# Patient Record
Sex: Female | Born: 1990 | Race: White | Hispanic: No | Marital: Single | Smoking: Current every day smoker
Health system: Southern US, Community
[De-identification: ages and names within clinical notes are randomized; demographics above are authoritative.]

## PROBLEM LIST (undated history)

## (undated) HISTORY — PX: TUBAL LIGATION: SHX77

---
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
## 2009-03-03 ENCOUNTER — Ambulatory Visit (HOSPITAL_COMMUNITY): Admission: RE | Admit: 2009-03-03 | Discharge: 2009-03-03 | Payer: Self-pay | Admitting: Obstetrics & Gynecology

## 2009-04-14 ENCOUNTER — Ambulatory Visit (HOSPITAL_COMMUNITY): Admission: RE | Admit: 2009-04-14 | Discharge: 2009-04-14 | Payer: Self-pay | Admitting: Obstetrics & Gynecology

## 2009-04-26 ENCOUNTER — Inpatient Hospital Stay (HOSPITAL_COMMUNITY): Admission: AD | Admit: 2009-04-26 | Discharge: 2009-04-26 | Payer: Self-pay | Admitting: Obstetrics & Gynecology

## 2009-04-26 ENCOUNTER — Ambulatory Visit: Payer: Self-pay | Admitting: Family

## 2009-05-02 ENCOUNTER — Ambulatory Visit (HOSPITAL_COMMUNITY): Admission: RE | Admit: 2009-05-02 | Discharge: 2009-05-02 | Payer: Self-pay | Admitting: Obstetrics and Gynecology

## 2009-05-04 ENCOUNTER — Ambulatory Visit: Payer: Self-pay | Admitting: Advanced Practice Midwife

## 2009-05-04 ENCOUNTER — Inpatient Hospital Stay (HOSPITAL_COMMUNITY): Admission: AD | Admit: 2009-05-04 | Discharge: 2009-05-05 | Payer: Self-pay | Admitting: Obstetrics and Gynecology

## 2009-05-06 ENCOUNTER — Inpatient Hospital Stay (HOSPITAL_COMMUNITY): Admission: AD | Admit: 2009-05-06 | Discharge: 2009-05-07 | Payer: Self-pay | Admitting: Obstetrics & Gynecology

## 2009-05-06 ENCOUNTER — Ambulatory Visit: Payer: Self-pay | Admitting: Obstetrics & Gynecology

## 2009-05-25 ENCOUNTER — Ambulatory Visit (HOSPITAL_COMMUNITY): Admission: RE | Admit: 2009-05-25 | Discharge: 2009-05-25 | Payer: Self-pay | Admitting: Obstetrics and Gynecology

## 2009-08-23 ENCOUNTER — Emergency Department (HOSPITAL_COMMUNITY): Admission: EM | Admit: 2009-08-23 | Discharge: 2009-08-23 | Payer: Self-pay | Admitting: Emergency Medicine

## 2009-09-12 ENCOUNTER — Emergency Department (HOSPITAL_COMMUNITY): Admission: EM | Admit: 2009-09-12 | Discharge: 2009-09-12 | Payer: Self-pay | Admitting: Emergency Medicine

## 2009-10-06 ENCOUNTER — Emergency Department (HOSPITAL_COMMUNITY): Admission: EM | Admit: 2009-10-06 | Discharge: 2009-10-06 | Payer: Self-pay | Admitting: Emergency Medicine

## 2010-02-05 ENCOUNTER — Encounter: Payer: Self-pay | Admitting: Obstetrics & Gynecology

## 2010-04-04 LAB — RAPID URINE DRUG SCREEN, HOSP PERFORMED
Barbiturates: NOT DETECTED
Benzodiazepines: NOT DETECTED
Cocaine: NOT DETECTED
Opiates: POSITIVE — AB
Tetrahydrocannabinol: NOT DETECTED

## 2010-04-04 LAB — URINALYSIS, ROUTINE W REFLEX MICROSCOPIC
Bilirubin Urine: NEGATIVE
Glucose, UA: NEGATIVE mg/dL
Hgb urine dipstick: NEGATIVE
Ketones, ur: NEGATIVE mg/dL
Nitrite: NEGATIVE
Protein, ur: NEGATIVE mg/dL
Specific Gravity, Urine: 1.01 (ref 1.005–1.030)
Urobilinogen, UA: 0.2 mg/dL (ref 0.0–1.0)
pH: 6.5 (ref 5.0–8.0)

## 2010-04-04 LAB — FETAL FIBRONECTIN: Fetal Fibronectin: POSITIVE — AB

## 2010-04-04 LAB — GC/CHLAMYDIA PROBE AMP, GENITAL: GC Probe Amp, Genital: NEGATIVE

## 2010-04-25 LAB — WET PREP, GENITAL: Trich, Wet Prep: NONE SEEN

## 2010-04-25 LAB — URINALYSIS, ROUTINE W REFLEX MICROSCOPIC
Glucose, UA: NEGATIVE mg/dL
Ketones, ur: NEGATIVE mg/dL
Specific Gravity, Urine: <1.005 — ABNORMAL LOW (ref 1.005–1.030)

## 2010-04-25 LAB — URINE MICROSCOPIC-ADD ON

## 2010-04-25 LAB — RAPID URINE DRUG SCREEN, HOSP PERFORMED: Tetrahydrocannabinol: NOT DETECTED

## 2010-04-27 LAB — DIFFERENTIAL
Eosinophils Relative: 1 % (ref 0–5)
Monocytes Relative: 4 % (ref 3–12)

## 2010-04-27 LAB — URINALYSIS, ROUTINE W REFLEX MICROSCOPIC
Bilirubin Urine: NEGATIVE
Glucose, UA: NEGATIVE mg/dL
Hgb urine dipstick: NEGATIVE
Nitrite: NEGATIVE
Urobilinogen, UA: 0.2 mg/dL (ref 0.0–1.0)

## 2010-04-27 LAB — URINE CULTURE

## 2010-04-27 LAB — CBC
Hemoglobin: 12.6 g/dL (ref 12.0–15.0)
MCHC: 34.8 g/dL (ref 30.0–36.0)
WBC: 5.9 10*3/uL (ref 4.0–10.5)

## 2010-05-31 IMAGING — US US OB DETAIL+14 WK
1 series · 14 of 28 positions shown · non-contrast
Comparison: none

OBSTETRICAL ULTRASOUND:
 This ultrasound was performed in The [HOSPITAL], and the [facility] report will be stored to [REDACTED] PACS.  This report is also available in [HOSPITAL]?s accessANYware.

[Series 1: us ob detail+14 wk · 14 of 93 slices shown]
[im 4/93]
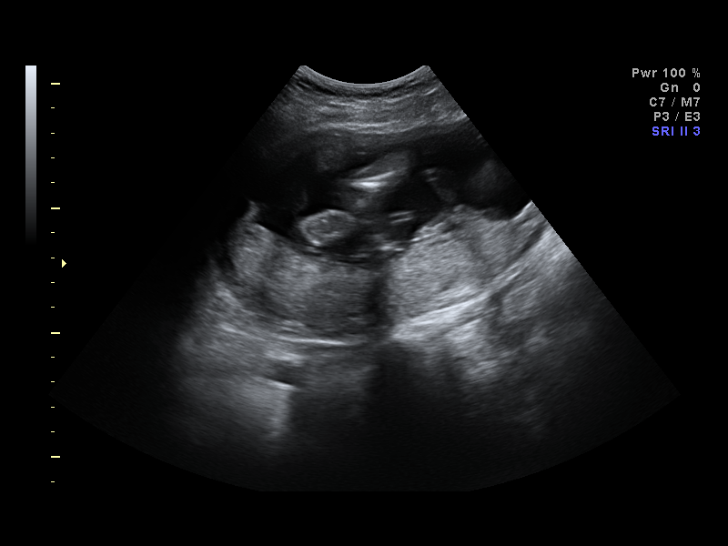
[im 11/93]
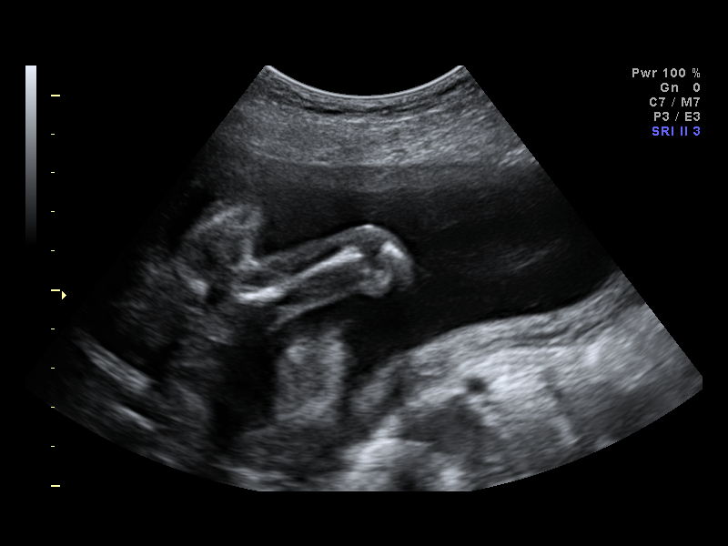
[im 18/93]
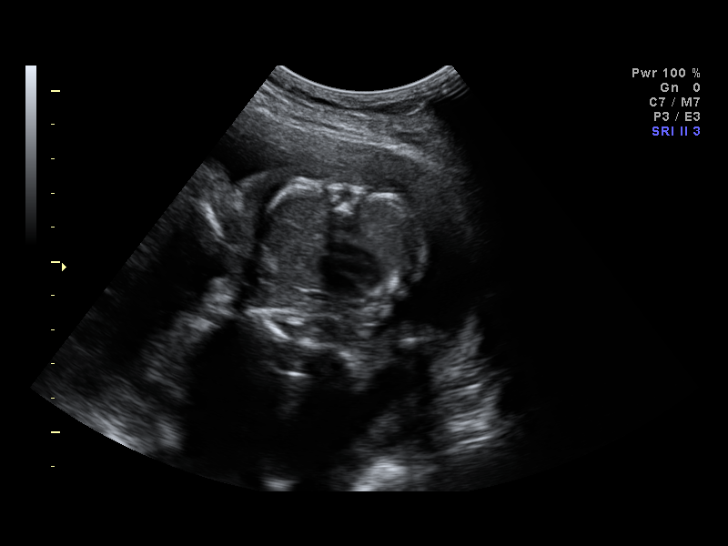
[im 24/93]
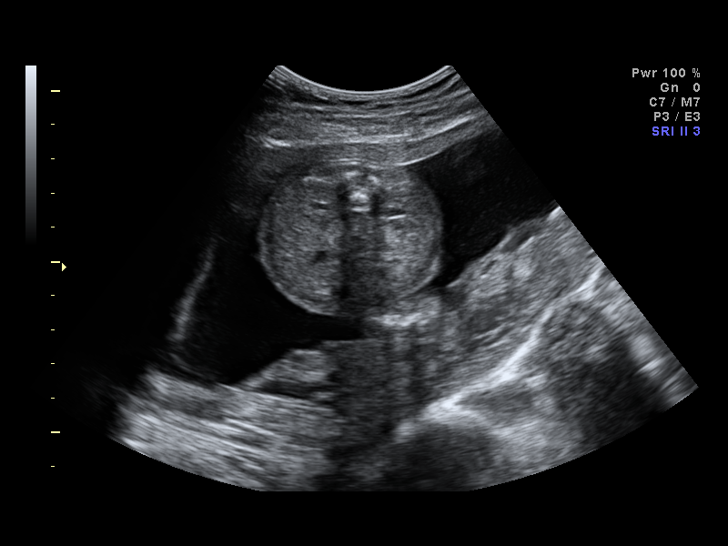
[im 31/93]
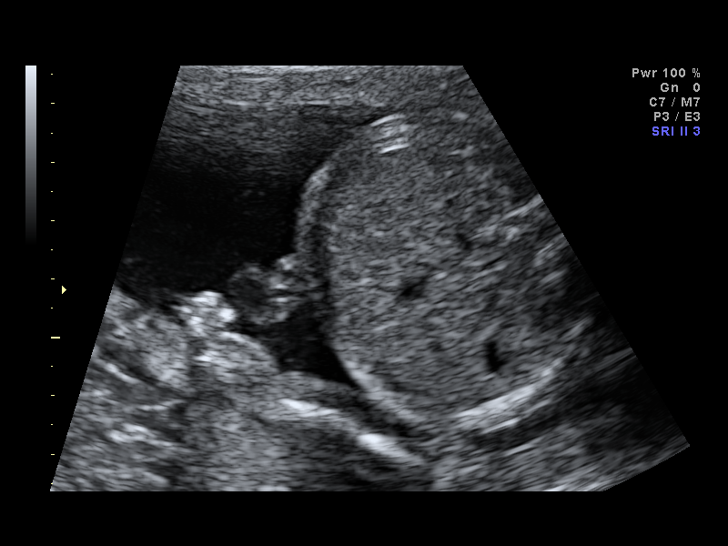
[im 38/93]
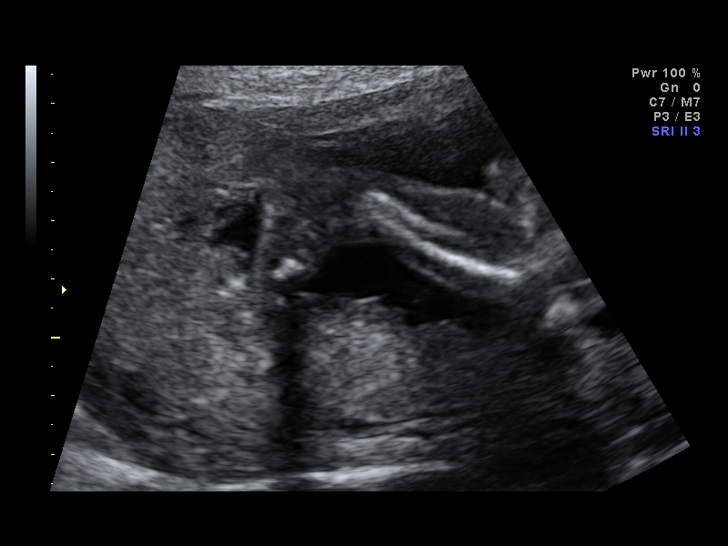
[im 45/93]
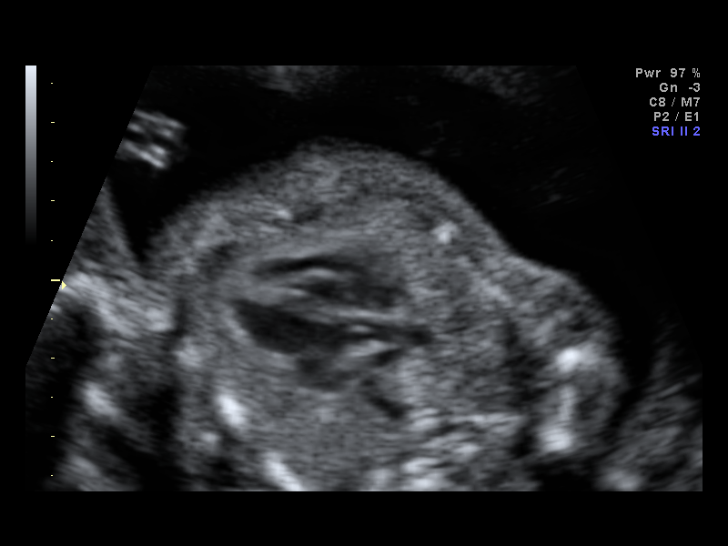
[im 52/93]
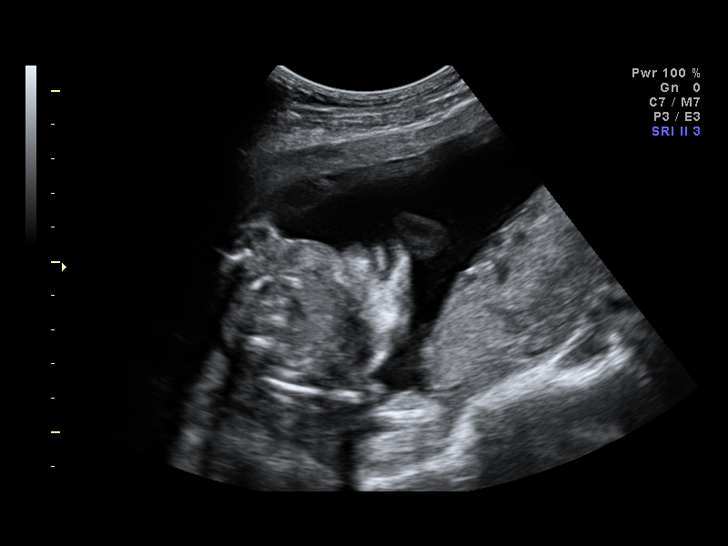
[im 58/93]
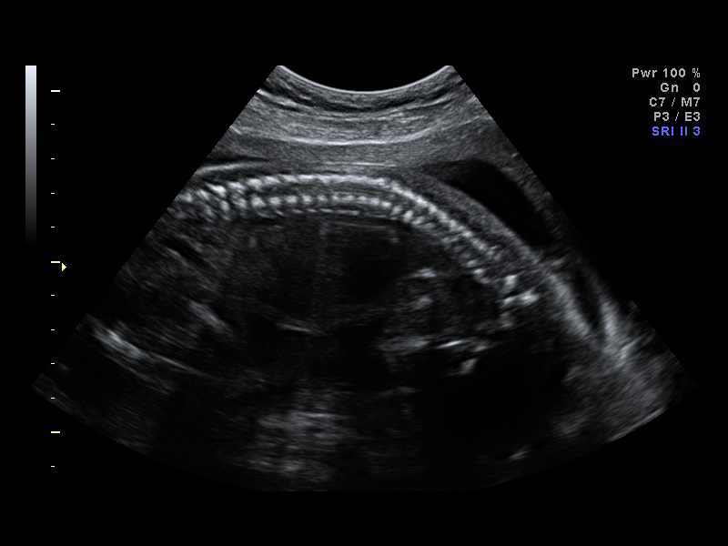
[im 65/93]
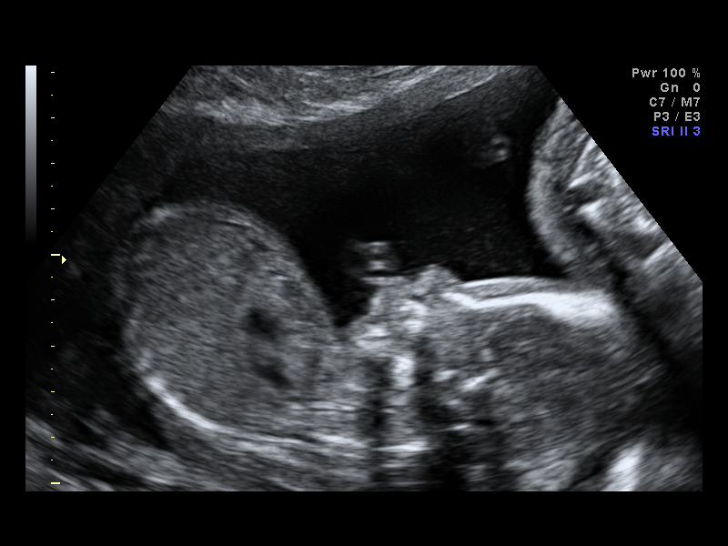
[im 72/93]
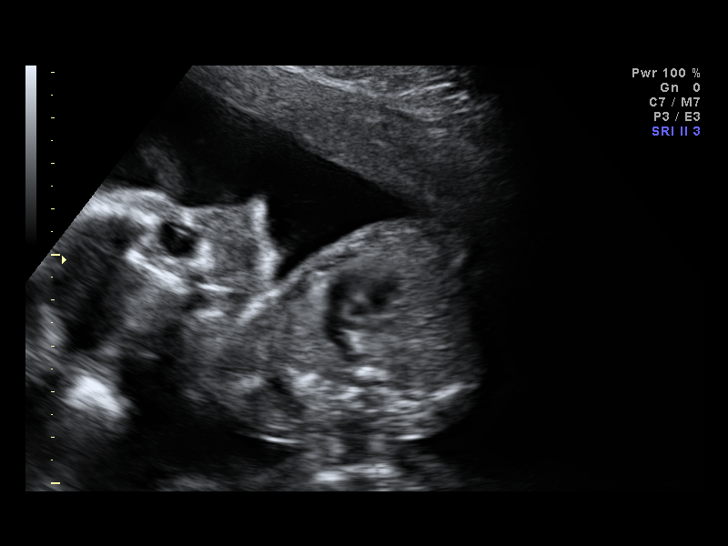
[im 79/93]
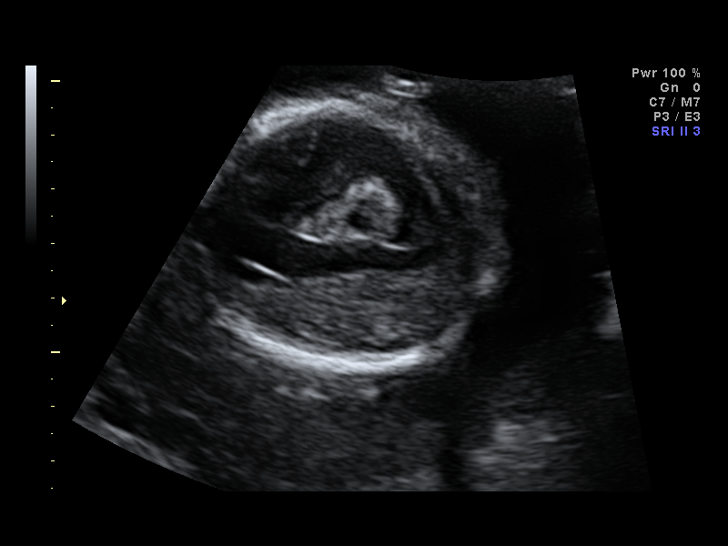
[im 86/93]
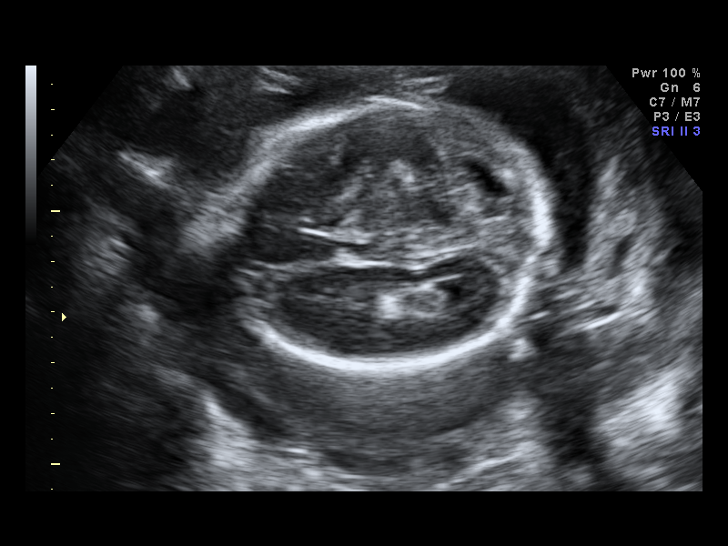
[im 93/93]
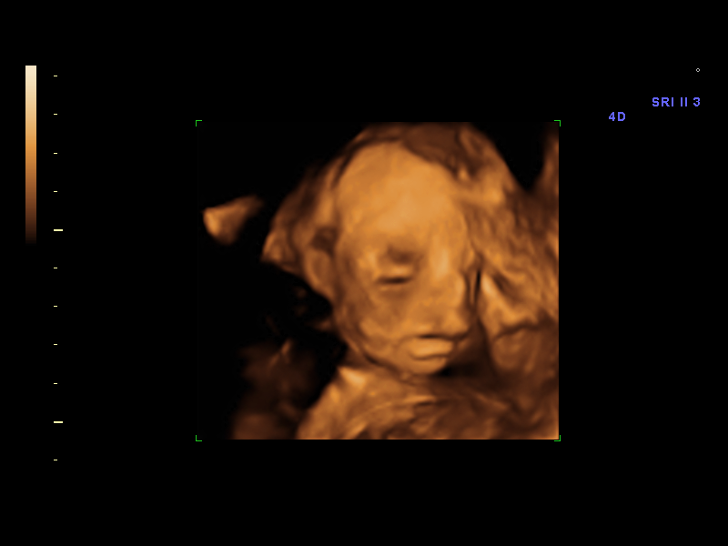

[14 of 28 positions shown; findings below may reference images not displayed]

IMPRESSION: [facility] has also been faxed to the ordering physician.

## 2010-06-02 NOTE — H&P (Signed)
[name], Carla Kidd             ACCOUNT NO.:  [id number]   MEDICAL RECORD NO.:  [id number]          PATIENT TYPE:  INP   LOCATION:  LDR1                          FACILITY:  [facility]   PHYSICIAN:  Lazaro Arms, M.D.   DATE OF BIRTH:  29-Aug-1990   DATE OF ADMISSION:  09/08/2005  DATE OF DISCHARGE:  LH                                HISTORY & PHYSICAL   CHIEF COMPLAINT:  Patient is a 20 year old white female, gravida 1, para 0,  with estimated date of delivery of 09/21/2005 currently at 38-2/7ths week  gestation.  She called just prior to admission complaining of onset of back  labor while she was swimming at her brother's birthday party.  She was told  to come in. She promptly presented to labor and delivery and at the time of  admission she was found to be 8 cm and 100% effaced with an intact bag of  waters.  I came in and ruptured her membranes at about 1825 and she had  maternal expulsive urges and was found to be complete.   PAST MEDICAL HISTORY:  Negative.   PAST SURGICAL HISTORY:  Negative.   PAST OBSTETRICAL HISTORY:  She is nulliparous.   SOCIAL HISTORY:  She is single.  She dropped out of school in the ninth  grade.  She smokes.   ALLERGIES:  She has no known drug allergies.   CURRENT MEDICATIONS:  Only medication is prenatal vitamins.   BLOOD WORK:  Her blood type if O positive.  She is rubella immune, antibody  screen is negative.  Hepatitis B is negative.  HIV is nonreactive.  HSV2 is  negative.  Serology is nonreactive.  Pap is HPV negative with ASCUS.  GC is  negative.  Chlamydia is negative x2.  Group B strep was negative.  Glucola  was normal at 85.   IMPRESSION:  1. Term intrauterine pregnancy [redacted] weeks gestation.  2. Advanced stage of labor.   PLAN:  Anticipate NSVD soon.      Lazaro Arms, M.D.  Electronically Signed    LHE/MEDQ  D:  09/08/2005  T:  09/08/2005  Job:  [id number]

## 2010-06-02 NOTE — Op Note (Signed)
NAMEDARIENNE, BELLEAU             ACCOUNT NO.:  [id number]   MEDICAL RECORD NO.:  [id number]          PATIENT TYPE:  INP   LOCATION:  A413                          FACILITY:  [facility]   PHYSICIAN:  Lazaro Arms, M.D.   DATE OF BIRTH:  Jan 30, 1990   DATE OF PROCEDURE:  09/08/2005  DATE OF DISCHARGE:                                 OPERATIVE REPORT   Randye is a 20 year old nulliparous female, [redacted] weeks gestation, presented to  labor and delivery in an advanced stage of labor.  Performed an amniotomy.  She was complete.  Placed lidocaine in perineum and let her push with a few  more contractions.  She had a very thick perineal body and transverse  perinei muscle.  A midline episiotomy was made, and over the episiotomy was  delivered a viable female infant at 1834 hours with Apgars of 9 and 9,  weighing 5 pounds and 1 ounce.  There was loose nuchal cord x1.  There was a  three vessel cord.  Cord blood and cord gas was seen.  The infant underwent  routine neonatal resuscitation.  Placenta was delivered spontaneously at  1843 hours, intact.  The uterus contracted down four fingerbreadths below  the umbilicus and was firm.  There was appropriate bleeding.  Her midline  episiotomy was repaired in the usual fashion and was without extension.  She  will undergo routine post partum care.      Lazaro Arms, M.D.  Electronically Signed     LHE/MEDQ  D:  09/08/2005  T:  09/09/2005  Job:  [id number]

## 2011-09-19 ENCOUNTER — Encounter (HOSPITAL_COMMUNITY): Payer: Self-pay | Admitting: Emergency Medicine

## 2011-09-19 ENCOUNTER — Emergency Department (HOSPITAL_COMMUNITY)
Admission: EM | Admit: 2011-09-19 | Discharge: 2011-09-19 | Disposition: A | Discharge status: Home / Self Care | Payer: Self-pay | Attending: Emergency Medicine | Admitting: Emergency Medicine

## 2011-09-19 DIAGNOSIS — R109 Unspecified abdominal pain: Secondary | ICD-10-CM

## 2011-09-19 DIAGNOSIS — N946 Dysmenorrhea, unspecified: Secondary | ICD-10-CM | POA: Insufficient documentation

## 2011-09-19 DIAGNOSIS — F17200 Nicotine dependence, unspecified, uncomplicated: Secondary | ICD-10-CM | POA: Insufficient documentation

## 2011-09-19 LAB — URINALYSIS, ROUTINE W REFLEX MICROSCOPIC
Bilirubin Urine: NEGATIVE
Hgb urine dipstick: NEGATIVE
Ketones, ur: NEGATIVE mg/dL
Leukocytes, UA: NEGATIVE
Nitrite: NEGATIVE
Protein, ur: NEGATIVE mg/dL
Urobilinogen, UA: 0.2 mg/dL (ref 0.0–1.0)
pH: 6 (ref 5.0–8.0)

## 2011-09-19 MED ORDER — ACETAMINOPHEN 500 MG PO TABS
1000.0000 mg | ORAL_TABLET | Freq: Once | ORAL | Status: AC
  Administered 2011-09-19: 1000 mg via ORAL
  Filled 2011-09-19: qty 2

## 2011-09-19 MED ORDER — NAPROXEN 500 MG PO TABS: 500.0000 mg | ORAL_TABLET | Freq: Two times a day (BID) | ORAL | Status: AC

## 2011-09-19 NOTE — ED Notes (Signed)
Pt in via ems with lower abd pain onset yesterday

## 2011-09-19 NOTE — ED Provider Notes (Signed)
History     CSN: [id number]  Arrival date & time 09/19/11  0217   First MD Initiated Contact with Patient 09/19/11 [phone]      Chief Complaint  Patient presents with  . Abdominal Pain    (Consider location/radiation/quality/duration/timing/severity/associated sxs/prior treatment) HPI  Carla Kidd is a 21 y.o. female brought in by ambulance, who presents to the Emergency Department complaining of lower abdominal pain associated with her menses which began yesterday.  Pain is sharp pain which is localized to the left lower quadrant. She took vicodin last yesterday with some relief and ibuprofen last night with no relief. She denies fever, chills, nausea, vomiting, diarrhea.    History reviewed. No pertinent past medical history.  Past Surgical History  Procedure Date  . Tubal ligation     No family history on file.  History  Substance Use Topics  . Smoking status: Current Everyday Smoker -- 0.5 packs/day    Types: Cigarettes  . Smokeless tobacco: Not on file  . Alcohol Use: No    OB History    Grav Para Term Preterm Abortions TAB SAB Ect Mult Living                  Review of Systems  Constitutional: Negative for fever.       10 Systems reviewed and are negative for acute change except as noted in the HPI.  HENT: Negative for congestion.   Eyes: Negative for discharge and redness.  Respiratory: Negative for cough and shortness of breath.   Cardiovascular: Negative for chest pain.  Gastrointestinal: Positive for abdominal pain. Negative for vomiting.  Musculoskeletal: Positive for back pain.  Skin: Negative for rash.  Neurological: Negative for syncope, numbness and headaches.  Psychiatric/Behavioral:       No behavior change.    Allergies  Tramadol  Home Medications  No current outpatient prescriptions on file.  BP 141/98  Pulse 77  Temp 97.7 F (36.5 C) (Oral)  Resp 19  Ht 5\' 5"  (1.651 m)  Wt 120 lb (54.432 kg)  BMI 19.97 kg/m2  SpO2 100%   LMP 09/17/2011  Physical Exam  Nursing note and vitals reviewed. Constitutional: She is oriented to person, place, and time. She appears well-developed and well-nourished.       Awake, alert, nontoxic appearance.  HENT:  Head: Atraumatic.  Eyes: Right eye exhibits no discharge. Left eye exhibits no discharge.  Neck: Neck supple.  Cardiovascular: Normal heart sounds.   Pulmonary/Chest: Effort normal and breath sounds normal. She exhibits no tenderness.  Abdominal: Soft. There is tenderness. There is no rebound.       Mild lower abdominal tenderness to palpation, more on left than right  Genitourinary:       No cva tenderness to percussion  Musculoskeletal: Normal range of motion. She exhibits no tenderness.       Baseline ROM, no obvious new focal weakness.  Neurological: She is alert and oriented to person, place, and time.       Mental status and motor strength appears baseline for patient and situation.  Skin: No rash noted.  Psychiatric: She has a normal mood and affect.    ED Course  Procedures (including critical care time) Results for orders placed during the hospital encounter of 09/19/11  URINALYSIS, ROUTINE W REFLEX MICROSCOPIC      Component Value Range   Color, Urine YELLOW  YELLOW   APPearance CLEAR  CLEAR   Specific Gravity, Urine <1.005 (*) 1.005 - 1.030  pH 6.0  5.0 - 8.0   Glucose, UA NEGATIVE  NEGATIVE mg/dL   Hgb urine dipstick NEGATIVE  NEGATIVE   Bilirubin Urine NEGATIVE  NEGATIVE   Ketones, ur NEGATIVE  NEGATIVE mg/dL   Protein, ur NEGATIVE  NEGATIVE mg/dL   Urobilinogen, UA 0.2  0.0 - 1.0 mg/dL   Nitrite NEGATIVE  NEGATIVE   Leukocytes, UA NEGATIVE  NEGATIVE     No diagnosis found.    MDM  Patient with lower abdominal pain associated with her menses. Pain is worse on the left side. She had taken vicodin and ibuprofen with some relief. Given tylenol while here with relief. UA negative for singes of infection. Pt stable in ED with no significant  deterioration in condition.The patient appears reasonably screened and/or stabilized for discharge and I doubt any other medical condition or other [facility] requiring further screening, evaluation, or treatment in the ED at this time prior to discharge.  MDM Reviewed: nursing note and vitals Interpretation: labs           Nicoletta Dress. Colon Branch, MD 09/19/11 4098

## 2011-09-19 NOTE — ED Notes (Addendum)
Pt states that she began experiencing abdominal discomfort on Saturday that she believed was related to her menstrual period.  On Monday, the pt states that the abdominal pain became so severe that it woke her from her sleep. Pt denies nausea or vomiting.

## 2012-02-19 ENCOUNTER — Emergency Department (HOSPITAL_COMMUNITY)
Admission: EM | Admit: 2012-02-19 | Discharge: 2012-02-19 | Disposition: A | Discharge status: Home / Self Care | Payer: Self-pay | Attending: Emergency Medicine | Admitting: Emergency Medicine

## 2012-02-19 ENCOUNTER — Encounter (HOSPITAL_COMMUNITY): Payer: Self-pay | Admitting: Emergency Medicine

## 2012-02-19 DIAGNOSIS — M5431 Sciatica, right side: Secondary | ICD-10-CM

## 2012-02-19 DIAGNOSIS — M5430 Sciatica, unspecified side: Secondary | ICD-10-CM | POA: Insufficient documentation

## 2012-02-19 DIAGNOSIS — F17200 Nicotine dependence, unspecified, uncomplicated: Secondary | ICD-10-CM | POA: Insufficient documentation

## 2012-02-19 LAB — POCT PREGNANCY, URINE: Preg Test, Ur: NEGATIVE

## 2012-02-19 MED ORDER — CYCLOBENZAPRINE HCL 5 MG PO TABS: 5.0000 mg | ORAL_TABLET | Freq: Three times a day (TID) | ORAL | Status: DC | PRN

## 2012-02-19 MED ORDER — IBUPROFEN 600 MG PO TABS: 600.0000 mg | ORAL_TABLET | Freq: Four times a day (QID) | ORAL | Status: AC | PRN

## 2012-02-19 NOTE — ED Notes (Signed)
Pt assisted to restroom, requiring a wheelchair to the door. Pt reports unable to stand up.

## 2012-02-19 NOTE — ED Provider Notes (Signed)
History     CSN: [id number]  Arrival date & time 02/19/12  1731   First MD Initiated Contact with Patient 02/19/12 1810      Chief Complaint  Patient presents with  . Back Pain    (Consider location/radiation/quality/duration/timing/severity/associated sxs/prior treatment) HPI Comments: Carla Kidd is a 22 y.o. Female presenting with acute  low back pain which has which has been present since late morning today after lifting heavy boxes during a move.    She has a history of chronic intermittent low back pain, but never with radiation as has progressed today down her left posterior leg to her great toe.  Pain is throbbing and nearly constant,  Except when at rest in certain positions.  She denies weakness or numbness in the extremity and no urinary or bowel retention or incontinence.  Patient does not have a history of cancer or IVDU.  She has taken tylenol with no relief of pain.      The history is provided by the patient.    History reviewed. No pertinent past medical history.  Past Surgical History  Procedure Date  . Tubal ligation     Family History  Problem Relation Age of Onset  . Cancer Father   . Stroke Other     History  Substance Use Topics  . Smoking status: Current Every Day Smoker -- 0.5 packs/day for 7 years    Types: Cigarettes  . Smokeless tobacco: Never Used  . Alcohol Use: No    OB History    Grav Para Term Preterm Abortions TAB SAB Ect Mult Living   4 4  4      4       Review of Systems  Constitutional: Negative for fever.  Respiratory: Negative for shortness of breath.   Cardiovascular: Negative for chest pain and leg swelling.  Gastrointestinal: Negative for abdominal pain, constipation and abdominal distention.  Genitourinary: Negative for dysuria, urgency, frequency, flank pain and difficulty urinating.  Musculoskeletal: Positive for back pain. Negative for joint swelling and gait problem.  Skin: Negative for rash.  Neurological:  Negative for weakness and numbness.    Allergies  Tramadol  Home Medications   Current Outpatient Rx  Name  Route  Sig  Dispense  Refill  . CYCLOBENZAPRINE HCL 5 MG PO TABS   Oral   Take 1 tablet (5 mg total) by mouth 3 (three) times daily as needed for muscle spasms.   15 tablet   0   . IBUPROFEN 600 MG PO TABS   Oral   Take 1 tablet (600 mg total) by mouth every 6 (six) hours as needed for pain.   20 tablet   0   . NAPROXEN 500 MG PO TABS   Oral   Take 1 tablet (500 mg total) by mouth 2 (two) times daily.   30 tablet   0     BP 147/76  Pulse 97  Temp 98 F (36.7 C) (Oral)  Resp 16  Ht 5\' 5"  (1.651 m)  Wt 108 lb (48.988 kg)  BMI 17.97 kg/m2  SpO2 98%  LMP 02/19/2012  Physical Exam  Nursing note and vitals reviewed. Constitutional: She appears well-developed and well-nourished.  HENT:  Head: Normocephalic.  Eyes: Conjunctivae normal are normal.  Neck: Normal range of motion. Neck supple.  Cardiovascular: Normal rate and intact distal pulses.        Pedal pulses normal.  Pulmonary/Chest: Effort normal.  Abdominal: Soft. Bowel sounds are normal. She exhibits  no distension and no mass.  Musculoskeletal: Normal range of motion. She exhibits no edema.       Lumbar back: She exhibits tenderness. She exhibits no swelling, no edema and no spasm.       Right paralumbar ttp,  TTP right SI joint.  Neurological: She is alert. She has normal strength. She displays no atrophy and no tremor. No sensory deficit. Gait normal.  Reflex Scores:      Patellar reflexes are 2+ on the right side and 2+ on the left side.      Achilles reflexes are 2+ on the right side and 2+ on the left side.      No strength deficit noted in hip and knee flexor and extensor muscle groups.  Ankle flexion and extension intact.   Skin: Skin is warm and dry.  Psychiatric: She has a normal mood and affect.    ED Course  Procedures (including critical care time)   Labs Reviewed  POCT PREGNANCY,  URINE   No results found.   1. Sciatica of right side       MDM  No neuro deficit on exam or by history to suggest emergent or surgical presentation.  Also discussed worsened sx that should prompt immediate re-evaluation including distal weakness, bowel/bladder retention/incontinence.  Pt prescribed ibuprofen and flexeril,  Encouraged ice x 24,  Then heating pad,  Rest,  Recheck if not improving over the next week.              Burgess Amor, [place] 02/19/12 1857

## 2012-02-19 NOTE — ED Notes (Signed)
Pt presents with lower chronic back pain that was aggravated this morning after moving heavy boxes. Pain radiates into rt leg and toe. Denies loss of bowel and bladder function. NAD noted. Pain has intensified throughout the day. Tylenol taken without relief. NAD noted.

## 2012-02-19 NOTE — ED Notes (Signed)
Patient c/o lower back pain that radiates down right leg into right toe after lifting heavy boxes. Per patient taken extra strength tylenol at 1300 with no relief.

## 2012-02-20 NOTE — ED Provider Notes (Signed)
Medical screening examination/treatment/procedure(s) were performed by non-physician practitioner and as supervising physician I was immediately available for consultation/collaboration.  Bartolo Montanye, MD 02/20/12 1455 

## 2013-11-16 ENCOUNTER — Encounter (HOSPITAL_COMMUNITY): Payer: Self-pay | Admitting: Emergency Medicine

## 2014-09-27 ENCOUNTER — Encounter (HOSPITAL_COMMUNITY): Payer: Self-pay | Admitting: Emergency Medicine

## 2014-09-27 ENCOUNTER — Emergency Department (HOSPITAL_COMMUNITY)
Admission: EM | Admit: 2014-09-27 | Discharge: 2014-09-27 | Disposition: A | Discharge status: Home / Self Care | Payer: Self-pay | Attending: Emergency Medicine | Admitting: Emergency Medicine

## 2014-09-27 DIAGNOSIS — A599 Trichomoniasis, unspecified: Secondary | ICD-10-CM

## 2014-09-27 DIAGNOSIS — Z3202 Encounter for pregnancy test, result negative: Secondary | ICD-10-CM | POA: Insufficient documentation

## 2014-09-27 DIAGNOSIS — Z720 Tobacco use: Secondary | ICD-10-CM | POA: Insufficient documentation

## 2014-09-27 DIAGNOSIS — A5901 Trichomonal vulvovaginitis: Secondary | ICD-10-CM | POA: Insufficient documentation

## 2014-09-27 LAB — URINALYSIS, ROUTINE W REFLEX MICROSCOPIC
BILIRUBIN URINE: NEGATIVE
GLUCOSE, UA: NEGATIVE mg/dL
KETONES UR: NEGATIVE mg/dL
NITRITE: NEGATIVE
PH: 6 (ref 5.0–8.0)
PROTEIN: NEGATIVE mg/dL
Specific Gravity, Urine: <1.005 — ABNORMAL LOW (ref 1.005–1.030)
Urobilinogen, UA: 0.2 mg/dL (ref 0.0–1.0)

## 2014-09-27 LAB — URINE MICROSCOPIC-ADD ON

## 2014-09-27 LAB — WET PREP, GENITAL: YEAST WET PREP: NONE SEEN

## 2014-09-27 LAB — POC URINE PREG, ED: Preg Test, Ur: NEGATIVE

## 2014-09-27 MED ORDER — AZITHROMYCIN 250 MG PO TABS: 1000.0000 mg | ORAL_TABLET | Freq: Once | ORAL | Status: AC

## 2014-09-27 MED ORDER — LIDOCAINE HCL (PF) 1 % IJ SOLN
INTRAMUSCULAR | Status: AC
  Administered 2014-09-27: 0.9 mL
  Filled 2014-09-27: qty 5

## 2014-09-27 MED ORDER — CEFTRIAXONE SODIUM 250 MG IJ SOLR
250.0000 mg | Freq: Once | INTRAMUSCULAR | Status: AC
  Administered 2014-09-27: 250 mg via INTRAMUSCULAR
  Filled 2014-09-27: qty 250

## 2014-09-27 MED ORDER — HYDROCODONE-ACETAMINOPHEN 5-325 MG PO TABS
1.0000 | ORAL_TABLET | Freq: Once | ORAL | Status: AC
  Administered 2014-09-27: 1 via ORAL
  Filled 2014-09-27: qty 1

## 2014-09-27 MED ORDER — METRONIDAZOLE 500 MG PO TABS
2000.0000 mg | ORAL_TABLET | Freq: Once | ORAL | Status: AC
  Administered 2014-09-27: 2000 mg via ORAL
  Filled 2014-09-27: qty 4

## 2014-09-27 NOTE — Discharge Instructions (Signed)
Trichomoniasis °Trichomoniasis is an infection caused by an organism called Trichomonas. The infection can affect both women and men. In women, the outer female genitalia and the vagina are affected. In men, the penis is mainly affected, but the prostate and other reproductive organs can also be involved. Trichomoniasis is a sexually transmitted infection (STI) and is most often passed to another person through sexual contact.  °RISK FACTORS °· Having unprotected sexual intercourse. °· Having sexual intercourse with an infected partner. °SIGNS AND SYMPTOMS  °Symptoms of trichomoniasis in women include: °· Abnormal gray-green frothy vaginal discharge. °· Itching and irritation of the vagina. °· Itching and irritation of the area outside the vagina. °Symptoms of trichomoniasis in men include:  °· Penile discharge with or without pain. °· Pain during urination. This results from inflammation of the urethra. °DIAGNOSIS  °Trichomoniasis may be found during a Pap test or physical exam. Your health care provider may use one of the following methods to help diagnose this infection: °· Examining vaginal discharge under a microscope. For men, urethral discharge would be examined. °· Testing the pH of the vagina with a test tape. °· Using a vaginal swab test that checks for the Trichomonas organism. A test is available that provides results within a few minutes. °· Doing a culture test for the organism. This is not usually needed. °TREATMENT  °· You may be given medicine to fight the infection. Women should inform their health care provider if they could be or are pregnant. Some medicines used to treat the infection should not be taken during pregnancy. °· Your health care provider may recommend over-the-counter medicines or creams to decrease itching or irritation. °· Your sexual partner will need to be treated if infected. °HOME CARE INSTRUCTIONS  °· Take medicines only as directed by your health care provider. °· Take  over-the-counter medicine for itching or irritation as directed by your health care provider. °· Do not have sexual intercourse while you have the infection. °· Women should not douche or wear tampons while they have the infection. °· Discuss your infection with your partner. Your partner may have gotten the infection from you, or you may have gotten it from your partner. °· Have your sex partner get examined and treated if necessary. °· Practice safe, informed, and protected sex. °· See your health care provider for other STI testing. °SEEK MEDICAL CARE IF:  °· You still have symptoms after you finish your medicine. °· You develop abdominal pain. °· You have pain when you urinate. °· You have bleeding after sexual intercourse. °· You develop a rash. °· Your medicine makes you sick or makes you throw up (vomit). °MAKE SURE YOU: °· Understand these instructions. °· Will watch your condition. °· Will get help right away if you are not doing well or get worse. °Document Released: 06/27/2000 Document Revised: 05/18/2013 Document Reviewed: 10/13/2012 °[employer]® Patient Information ©2015 [employer]. This information is not intended to replace advice given to you by your health care provider. Make sure you discuss any questions you have with your health care provider. ° °Sexually Transmitted Disease °A sexually transmitted disease (STD) is a disease or infection often passed to another person during sex. However, STDs can be passed through nonsexual ways. An STD can be passed through: °· Spit (saliva). °· Semen. °· Blood. °· Mucus from the vagina. °· Pee (urine). °HOW CAN I LESSEN MY CHANCES OF GETTING AN STD? °· Use: °¨ Latex condoms. °¨ Water-soluble lubricants with condoms. Do not use petroleum   jelly or oils. °¨ Dental dams. These are small pieces of latex that are used as a barrier during oral sex. °· Avoid having more than one sex partner. °· Do not have sex with someone who has other sex partners. °· Do not have  sex with anyone you do not know or who is at high risk for an STD. °· Avoid risky sex that can break your skin. °· Do not have sex if you have open sores on your mouth or skin. °· Avoid drinking too much alcohol or taking illegal drugs. Alcohol and drugs can affect your good judgment. °· Avoid oral and anal sex acts. °· Get shots (vaccines) for HPV and hepatitis. °· If you are at risk of being infected with HIV, it is advised that you take a certain medicine daily to prevent HIV infection. This is called pre-exposure prophylaxis (PrEP). You may be at risk if: °¨ You are a man who has sex with other men (MSM). °¨ You are attracted to the opposite sex (heterosexual) and are having sex with more than one partner. °¨ You take drugs with a needle. °¨ You have sex with someone who has HIV. °· Talk with your doctor about if you are at high risk of being infected with HIV. If you begin to take PrEP, get tested for HIV first. Get tested every 3 months for as long as you are taking PrEP. °WHAT SHOULD I DO IF I THINK I HAVE AN STD? °· See your doctor. °· Tell your sex partner(s) that you have an STD. They should be tested and treated. °· Do not have sex until your doctor says it is okay. °WHEN SHOULD I GET HELP? °Get help right away if: °· You have bad belly (abdominal) pain. °· You are a man and have puffiness (swelling) or pain in your testicles. °· You are a woman and have puffiness in your vagina. °Document Released: 02/09/2004 Document Revised: 01/06/2013 Document Reviewed: 06/27/2012 °Exit[employer]® Patient Information ©2015 [employer]. This information is not intended to replace advice given to you by your health care provider. Make sure you discuss any questions you have with your health care provider. ° °

## 2014-09-27 NOTE — ED Notes (Signed)
Pt reports hematuria and lower pelvic discomfort since 9/2. Pt states she was seen at [facility] and dx with a bladder infection. Pt has finished abx and reports symptoms are no better.

## 2014-09-27 NOTE — ED Provider Notes (Signed)
CSN: [id number]     Arrival date & time 09/27/14  1628 History  This chart was scribed for non-physician practitioner, Pauline Aus, PA-C working with Devoria Albe, MD, by Jarvis Morgan, ED Scribe. This patient was seen in room APFT22/APFT22 and the patient's care was started at 6:58 PM    Chief Complaint  Patient presents with  . Hematuria    The history is provided by the patient. No language interpreter was used.    HPI Comments: Carla Kidd is a 24 y.o. female who presents to the Emergency Department complaining of intermittent, mild, hematuria onset 10 days. She reports associated lower pelvic pain, dysuria/sharp pain after voiding urine, urinary urgency and left flank pain. Pt states she was seen at [facility], 10 days ago and was diagnosed with an infection around her bladder due to presence of hematuria in her UA. She was prescribed Keflex and has been complaint with the medication. She states she finished the round of antibiotics 3 days ago. Pt endorses that she did not receive any significant improvement with the Keflex medication. While at [facility] she had a CAT scan which was negative for kidney stones or any other acute findings, but states a pelvic exam was not performed. She admits she is sexually active but has not had a new sexual partner in 10 years. She does not suspect any cheating or concern for STIs. She does not use BC and endorses she had a tubal ligation several years ago. She denies any h/o recurrent UTIs. She denies any vaginal discharge, bleeding, fever, chills, nausea or vomiting.  History reviewed. No pertinent past medical history. Past Surgical History  Procedure Laterality Date  . Tubal ligation     Family History  Problem Relation Age of Onset  . Cancer Father   . Stroke Other    Social History  Substance Use Topics  . Smoking status: Current Every Day Smoker -- 0.50 packs/day for 7 years    Types: Cigarettes  . Smokeless tobacco: Never Used   . Alcohol Use: No   OB History    Gravida Para Term Preterm AB TAB SAB Ectopic Multiple Living   4 4  4      4      Review of Systems  Constitutional: Negative for fever and chills.  Gastrointestinal: Negative for nausea and vomiting.  Genitourinary: Positive for dysuria, urgency, hematuria, difficulty urinating and pelvic pain. Negative for vaginal bleeding and vaginal discharge.  Musculoskeletal: Negative for back pain.  All other systems reviewed and are negative.     Allergies  Tramadol  Home Medications   Prior to Admission medications   Not on File   Triage Vitals: BP 144/93 mmHg  Pulse 82  Temp(Src) 97.4 F (36.3 C) (Oral)  Resp 16  Ht 5\' 5"  (1.651 m)  Wt 120 lb (54.432 kg)  BMI 19.97 kg/m2  SpO2 100%  LMP 09/14/2014  Physical Exam  Constitutional: She is oriented to person, place, and time. She appears well-developed and well-nourished. No distress.  HENT:  Head: Normocephalic and atraumatic.  Eyes: Conjunctivae and EOM are normal.  Neck: Neck supple. No tracheal deviation present.  Cardiovascular: Normal rate.   Pulmonary/Chest: Effort normal. No respiratory distress.  Abdominal: Soft. There is tenderness (mild) in the suprapubic area. There is no CVA tenderness.  Genitourinary: Uterus normal. There is no lesion on the right labia. There is no lesion on the left labia. Cervix exhibits no motion tenderness. Right adnexum displays no mass and no  tenderness. Left adnexum displays no mass and no tenderness. No tenderness or bleeding in the vagina. No foreign body around the vagina. Vaginal discharge found.  Thin, white discharge present.    Musculoskeletal: Normal range of motion.  Neurological: She is alert and oriented to person, place, and time.  Skin: Skin is warm and dry.  Psychiatric: She has a normal mood and affect. Her behavior is normal.  Nursing note and vitals reviewed.   ED Course  Procedures (including critical care time)   DIAGNOSTIC  STUDIES: Oxygen Saturation is 100% on RA, normal by my interpretation.    COORDINATION OF CARE: 6:00 PM- Will order UA and POC urine preg. Pt advised of plan for treatment and pt agrees.  6:52 PM- POC urine preg was negative  Labs Review Labs Reviewed  URINALYSIS, ROUTINE W REFLEX MICROSCOPIC (NOT AT [facility]) - Abnormal; Notable for the following:    Color, Urine STRAW (*)    Specific Gravity, Urine <1.005 (*)    Hgb urine dipstick TRACE (*)    Leukocytes, UA TRACE (*)    All other components within normal limits  URINE MICROSCOPIC-ADD ON - Abnormal; Notable for the following:    Squamous Epithelial / LPF FEW (*)    Bacteria, UA FEW (*)    All other components within normal limits  POC URINE PREG, ED   Imaging Review No results found. I have personally reviewed and evaluated these images and lab results as part of my medical decision-making.   EKG Interpretation None     GC chlamydia cultures pending   MDM   Final diagnoses:  Trichimoniasis   Pt informed of results.  Advised that sexual partners tested.  I will treat for STI's , IM rocephin and po flagyl given here.  Due to excessive amt of antibiotic tonight, I have written prescription for the zithromax that she agrees to take tomorrow.  She is ambulatory, well appearing and non-toxic.  No concerning sx's for torsion, PID, TOA.  Agrees to arrange f/u with the health dept if needed.  Appears stable for d/c.    I personally performed the services described in this documentation, which was scribed in my presence. The recorded information has been reviewed and is accurate.      Pauline Aus, PA-C 09/30/14 1610  Devoria Albe, MD 09/30/14 [phone]

## 2014-09-29 LAB — GC/CHLAMYDIA PROBE AMP (~~LOC~~) NOT AT ARMC
CHLAMYDIA, DNA PROBE: NEGATIVE
NEISSERIA GONORRHEA: NEGATIVE

## 2014-09-30 LAB — URINE CULTURE

## 2014-10-01 ENCOUNTER — Telehealth (HOSPITAL_COMMUNITY): Payer: Self-pay | Admitting: Emergency Medicine

## 2014-10-01 NOTE — Progress Notes (Signed)
ED Antimicrobial Stewardship Positive Culture Follow Up   Carla Kidd is an 24 y.o. female who presented to [facility] on 09/27/2014 with a chief complaint of  Chief Complaint  Patient presents with  . Hematuria    Recent Results (from the past 720 hour(s))  Urine culture     Status: None   Collection Time: 09/27/14  6:15 PM  Result Value Ref Range Status   Specimen Description URINE, CLEAN CATCH  Final   Special Requests NONE  Final   Culture   Final    >=100,000 COLONIES/mL ESCHERICHIA COLI Performed at [facility]    Report Status 09/30/2014 FINAL  Final   Organism ID, Bacteria ESCHERICHIA COLI  Final      Susceptibility   Escherichia coli - MIC*    AMPICILLIN >=32 RESISTANT Resistant     CEFAZOLIN >=64 RESISTANT Resistant     CEFTRIAXONE <=1 SENSITIVE Sensitive     CIPROFLOXACIN <=0.25 SENSITIVE Sensitive     GENTAMICIN <=1 SENSITIVE Sensitive     IMIPENEM <=0.25 SENSITIVE Sensitive     NITROFURANTOIN <=16 SENSITIVE Sensitive     TRIMETH/SULFA <=20 SENSITIVE Sensitive     AMPICILLIN/SULBACTAM >=32 RESISTANT Resistant     PIP/TAZO 64 INTERMEDIATE Intermediate     * >=100,000 COLONIES/mL ESCHERICHIA COLI  Wet prep, genital     Status: Abnormal   Collection Time: 09/27/14  8:30 PM  Result Value Ref Range Status   Yeast Wet Prep HPF POC NONE SEEN NONE SEEN Final   Trich, Wet Prep MANY (A) NONE SEEN Final   Clue Cells Wet Prep HPF POC MODERATE (A) NONE SEEN Final   WBC, Wet Prep HPF POC FEW (A) NONE SEEN Final     Treated with Azithromycin, organism resistant to prescribed antimicrobial  New antibiotic prescription: Ciprofloxacin 250 mg PO BID x 3 days  ED Provider: Trixie Dredge, PA-C   Tennis Must L 10/01/2014, 9:24 AM Infectious Diseases Pharmacist Phone# [phone]

## 2014-10-01 NOTE — Telephone Encounter (Incomplete)
Post ED Visit - Positive Culture Follow-up: Successful Patient Follow-Up  Culture assessed and recommendations reviewed by:  Celedonio Miyamoto, Pharm.D., BCPS-AQ ID  Georgina Pillion, Pharm.D., BCPS  [place], [place].D., BCPS, AAHIVP  Estella Husk, Pharm.D., BCPS, AAHIVP  [place], [address].D.  Tennis Must, Pharm.D.  Positive Urine culture   Patient discharged without antimicrobial prescription and treatment is now indicated  Organism is resistant to prescribed ED discharge antimicrobial  Patient with positive blood cultures  Changes discussed with ED provider: Trixie Dredge PA New antibiotic prescription Cipro 250 mg BID x three days Called to [facility] [phone]  Contacted patient, date ***, time ***   Jiles Harold 10/01/2014, 3:39 PM
